# Patient Record
Sex: Female | Born: 1980 | Race: White | Hispanic: No | Marital: Single | Smoking: Never smoker
Health system: Southern US, Community
[De-identification: ages and names within clinical notes are randomized; demographics above are authoritative.]

## PROBLEM LIST (undated history)

## (undated) HISTORY — PX: NO PAST SURGERIES: SHX2092

---

## 2011-11-27 ENCOUNTER — Emergency Department: Payer: Self-pay | Admitting: Internal Medicine

## 2011-11-27 LAB — URINALYSIS, COMPLETE
Bacteria: NONE SEEN
Glucose,UR: NEGATIVE mg/dL (ref 0–75)
Ketone: NEGATIVE
Nitrite: NEGATIVE
Protein: NEGATIVE
RBC,UR: 1 /HPF (ref 0–5)
Specific Gravity: 1.024 (ref 1.003–1.030)
Squamous Epithelial: 12
WBC UR: 14 /HPF (ref 0–5)

## 2011-11-27 LAB — DRUG SCREEN, URINE
Barbiturates, Ur Screen: NEGATIVE (ref ?–200)
Benzodiazepine, Ur Scrn: NEGATIVE (ref ?–200)
Cannabinoid 50 Ng, Ur ~~LOC~~: NEGATIVE (ref ?–50)
Cocaine Metabolite,Ur ~~LOC~~: NEGATIVE (ref ?–300)
MDMA (Ecstasy)Ur Screen: NEGATIVE (ref ?–500)
Opiate, Ur Screen: NEGATIVE (ref ?–300)

## 2011-11-27 LAB — CBC
HCT: 40 % (ref 35.0–47.0)
HGB: 13.2 g/dL (ref 12.0–16.0)
MCH: 28.6 pg (ref 26.0–34.0)
MCHC: 33.1 g/dL (ref 32.0–36.0)
MCV: 86 fL (ref 80–100)
RDW: 13.5 % (ref 11.5–14.5)

## 2011-11-27 LAB — COMPREHENSIVE METABOLIC PANEL
Albumin: 3.8 g/dL (ref 3.4–5.0)
Anion Gap: 7 (ref 7–16)
BUN: 11 mg/dL (ref 7–18)
Bilirubin,Total: 0.3 mg/dL (ref 0.2–1.0)
Chloride: 110 mmol/L — ABNORMAL HIGH (ref 98–107)
Creatinine: 0.93 mg/dL (ref 0.60–1.30)
Glucose: 92 mg/dL (ref 65–99)
Potassium: 4.7 mmol/L (ref 3.5–5.1)
SGOT(AST): 21 U/L (ref 15–37)
SGPT (ALT): 21 U/L (ref 12–78)
Total Protein: 8 g/dL (ref 6.4–8.2)

## 2011-11-27 LAB — PROTIME-INR
INR: 1
Prothrombin Time: 13.4 secs (ref 11.5–14.7)

## 2013-04-17 ENCOUNTER — Emergency Department: Payer: Self-pay | Admitting: Emergency Medicine

## 2013-04-17 LAB — COMPREHENSIVE METABOLIC PANEL
ALBUMIN: 4 g/dL (ref 3.4–5.0)
Alkaline Phosphatase: 88 U/L
Anion Gap: 5 — ABNORMAL LOW (ref 7–16)
BILIRUBIN TOTAL: 0.2 mg/dL (ref 0.2–1.0)
BUN: 11 mg/dL (ref 7–18)
CREATININE: 0.83 mg/dL (ref 0.60–1.30)
Calcium, Total: 9.2 mg/dL (ref 8.5–10.1)
Chloride: 113 mmol/L — ABNORMAL HIGH (ref 98–107)
Co2: 20 mmol/L — ABNORMAL LOW (ref 21–32)
EGFR (African American): >60
EGFR (Non-African Amer.): >60
Glucose: 93 mg/dL (ref 65–99)
Osmolality: 275 (ref 275–301)
Potassium: 3.9 mmol/L (ref 3.5–5.1)
SGOT(AST): 28 U/L (ref 15–37)
SGPT (ALT): 31 U/L (ref 12–78)
SODIUM: 138 mmol/L (ref 136–145)
TOTAL PROTEIN: 8.3 g/dL — AB (ref 6.4–8.2)

## 2013-04-17 LAB — CBC WITH DIFFERENTIAL/PLATELET
BASOS ABS: 0 10*3/uL (ref 0.0–0.1)
Basophil %: 0.4 %
Eosinophil #: 0.1 10*3/uL (ref 0.0–0.7)
Eosinophil %: 0.9 %
HCT: 43.2 % (ref 35.0–47.0)
HGB: 14.4 g/dL (ref 12.0–16.0)
LYMPHS PCT: 22.2 %
Lymphocyte #: 2.2 10*3/uL (ref 1.0–3.6)
MCH: 28.6 pg (ref 26.0–34.0)
MCHC: 33.4 g/dL (ref 32.0–36.0)
MCV: 86 fL (ref 80–100)
MONO ABS: 0.8 x10 3/mm (ref 0.2–0.9)
MONOS PCT: 7.7 %
NEUTROS PCT: 68.8 %
Neutrophil #: 6.8 10*3/uL — ABNORMAL HIGH (ref 1.4–6.5)
Platelet: 351 10*3/uL (ref 150–440)
RBC: 5.05 10*6/uL (ref 3.80–5.20)
RDW: 13.4 % (ref 11.5–14.5)
WBC: 9.9 10*3/uL (ref 3.6–11.0)

## 2013-04-17 LAB — URINALYSIS, COMPLETE
BILIRUBIN, UR: NEGATIVE
Glucose,UR: NEGATIVE mg/dL (ref 0–75)
Ketone: NEGATIVE
Leukocyte Esterase: NEGATIVE
Nitrite: NEGATIVE
Ph: 5 (ref 4.5–8.0)
RBC,UR: 2912 /HPF (ref 0–5)
Specific Gravity: 1.031 (ref 1.003–1.030)
Squamous Epithelial: 16

## 2018-05-08 ENCOUNTER — Encounter: Payer: Self-pay | Admitting: Emergency Medicine

## 2018-05-08 ENCOUNTER — Other Ambulatory Visit: Payer: Self-pay

## 2018-05-08 ENCOUNTER — Emergency Department
Admission: EM | Admit: 2018-05-08 | Discharge: 2018-05-08 | Disposition: A | Discharge status: Home / Self Care | Payer: No Typology Code available for payment source | Attending: Emergency Medicine | Admitting: Emergency Medicine

## 2018-05-08 DIAGNOSIS — S61212A Laceration without foreign body of right middle finger without damage to nail, initial encounter: Secondary | ICD-10-CM | POA: Diagnosis not present

## 2018-05-08 DIAGNOSIS — W269XXA Contact with unspecified sharp object(s), initial encounter: Secondary | ICD-10-CM | POA: Diagnosis not present

## 2018-05-08 DIAGNOSIS — Y939 Activity, unspecified: Secondary | ICD-10-CM | POA: Diagnosis not present

## 2018-05-08 DIAGNOSIS — Y990 Civilian activity done for income or pay: Secondary | ICD-10-CM | POA: Insufficient documentation

## 2018-05-08 DIAGNOSIS — Y929 Unspecified place or not applicable: Secondary | ICD-10-CM | POA: Insufficient documentation

## 2018-05-08 DIAGNOSIS — S6991XA Unspecified injury of right wrist, hand and finger(s), initial encounter: Secondary | ICD-10-CM | POA: Diagnosis present

## 2018-05-08 MED ORDER — LIDOCAINE HCL (PF) 1 % IJ SOLN
5.0000 mL | Freq: Once | INTRAMUSCULAR | Status: AC
  Administered 2018-05-08: 5 mL
  Filled 2018-05-08: qty 5

## 2018-05-08 NOTE — ED Provider Notes (Addendum)
[facility] Emergency Department Provider Note ____________________________________________  Time seen: 2118  I have reviewed the triage vital signs and the nursing notes.  HISTORY  Chief Complaint  Laceration  HPI Sally Davis is a 38 y.o. female presents to the ED from work, for evaluation of a work injury.  She reports she accidentally cut the palmar aspect of her right hand at the base of the middle finger, on a piece of metal at work.  She presents now for evaluation of her injury.  She denies any other injury at this time.  She reports her tetanus status is up-to-date.  History reviewed. No pertinent past medical history.  There are no active problems to display for this patient.  History reviewed. No pertinent surgical history.  Prior to Admission medications   Not on File    Allergies Morphine and related  No family history on file.  Social History Social History   Tobacco Use  . Smoking status: Never Smoker  . Smokeless tobacco: Never Used  Substance Use Topics  . Alcohol use: Not on file  . Drug use: Not on file    Review of Systems  Constitutional: Negative for fever. Cardiovascular: Negative for chest pain. Respiratory: Negative for shortness of breath. Musculoskeletal: Negative for back pain. Right middle finger laceration as above Skin: Negative for rash. Neurological: Negative for headaches, focal weakness or numbness. ____________________________________________  PHYSICAL EXAM:  VITAL SIGNS: ED Triage Vitals  Enc Vitals Group     BP 05/08/18 2041 130/80     Pulse Rate 05/08/18 2041 66     Resp 05/08/18 2041 18     Temp 05/08/18 2041 98 F (36.7 C)     Temp Source 05/08/18 2041 Oral     SpO2 05/08/18 2041 100 %     Weight 05/08/18 2038 275 lb (124.7 kg)     Height 05/08/18 2038 5\' 6"  (1.676 m)     Head Circumference --      Peak Flow --      Pain Score 05/08/18 2038 4     Pain Loc --      Pain Edu? --    Excl. in GC? --     Constitutional: Alert and oriented. Well appearing and in no distress. Head: Normocephalic and atraumatic. Eyes: Conjunctivae are normal. Normal extraocular movements Cardiovascular: Normal rate, regular rhythm. Normal distal pulses. Respiratory: Normal respiratory effort. No wheezes/rales/rhonchi. Musculoskeletal: Normal composite fist on the right hand.  Patient with a superficial laceration along the palmar crease at the base of the middle finger.  Nontender with normal range of motion in all extremities.  Neurologic:  Normal gross sensation.  Normal speech and language. No gross focal neurologic deficits are appreciated. Skin:  Skin is warm, dry and intact. No rash noted. ____________________________________________  PROCEDURES  .Marland Kitchen.Laceration Repair Date/Time: 05/08/2018 10:49 PM Performed by: Lissa HoardMenshew, Jairo Bellew V Bacon, PA-C Authorized by: Lissa HoardMenshew, Alfhild Partch V Bacon, PA-C   Consent:    Consent obtained:  Verbal   Consent given by:  Patient   Risks discussed:  Pain and infection Anesthesia (see MAR for exact dosages):    Anesthesia method: transthecal block. Laceration details:    Location:  Finger   Finger location:  R long finger   Length (cm):  2   Depth (mm):  3 Repair type:    Repair type:  Simple Pre-procedure details:    Preparation:  Patient was prepped and draped in usual sterile fashion Treatment:    Area  cleansed with:  Betadine   Amount of cleaning:  Standard   Irrigation solution:  Sterile saline   Irrigation method:  Syringe Skin repair:    Repair method:  Sutures   Suture size:  4-0   Suture material:  Nylon   Suture technique:  Simple interrupted   Number of sutures:  3 Approximation:    Approximation:  Close Post-procedure details:    Dressing:  Non-adherent dressing and splint for protection   Patient tolerance of procedure:  Tolerated well, no immediate complications  ____________________________________________  INITIAL  IMPRESSION / ASSESSMENT AND PLAN / ED COURSE  Patient with ED evaluation management of an accidental laceration to the right middle finger while at work.  Patient is wound is repaired using normal sutures.  Wound care instructions are provided to the patient.  She will return to work with instructions to keep the wound and/or dressing, clean and dry.  She will follow-up with her provider or the company's provider for suture removal in 7 to 10 days. ____________________________________________  FINAL CLINICAL IMPRESSION(S) / ED DIAGNOSES  Final diagnoses:  Laceration of right middle finger without foreign body without damage to nail, initial encounter      Lissa HoardMenshew, Ashwika Freels V Bacon, PA-C 05/08/18 [address]2252    Jorgina Binning, Charlesetta IvoryJenise V Bacon, PA-C 05/08/18 2252    Sharman CheekStafford, Phillip, MD 05/08/18 [phone]

## 2018-05-08 NOTE — Discharge Instructions (Addendum)
Keep the wound clean, dry, and covered. Follow-up with your provider or your company's provider in 7-10 days for suture removal. Take Tylenol and Motrin for pain, as needed.

## 2018-05-08 NOTE — ED Triage Notes (Signed)
Patient ambulatory to triage with steady gait, without difficulty or distress noted; pt reports cutting right middle finger on piece of metal while at work [employer]; pt employed with [employer] in [place] (workers comp profile indicates testing only on request)

## 2018-05-15 DIAGNOSIS — Z4800 Encounter for change or removal of nonsurgical wound dressing: Secondary | ICD-10-CM | POA: Insufficient documentation

## 2018-05-15 DIAGNOSIS — Z5321 Procedure and treatment not carried out due to patient leaving prior to being seen by health care provider: Secondary | ICD-10-CM | POA: Insufficient documentation

## 2018-05-15 NOTE — ED Triage Notes (Signed)
Patient reports cutting 3rd digit right hand on sheet metal on 1/30, sutures placed at this ED. Redness seen at suture line. Patient purulent drainage at home.

## 2018-05-16 ENCOUNTER — Emergency Department
Admission: EM | Admit: 2018-05-16 | Discharge: 2018-05-16 | Disposition: A | Discharge status: Home / Self Care | Payer: No Typology Code available for payment source | Attending: Emergency Medicine | Admitting: Emergency Medicine

## 2018-05-16 NOTE — ED Notes (Signed)
No answer when called several times from lobby 

## 2018-05-18 ENCOUNTER — Encounter: Payer: Self-pay | Admitting: Emergency Medicine

## 2018-05-18 ENCOUNTER — Emergency Department
Admission: EM | Admit: 2018-05-18 | Discharge: 2018-05-18 | Disposition: A | Discharge status: Home / Self Care | Payer: No Typology Code available for payment source | Attending: Emergency Medicine | Admitting: Emergency Medicine

## 2018-05-18 ENCOUNTER — Other Ambulatory Visit: Payer: Self-pay

## 2018-05-18 DIAGNOSIS — X58XXXD Exposure to other specified factors, subsequent encounter: Secondary | ICD-10-CM | POA: Diagnosis not present

## 2018-05-18 DIAGNOSIS — S61212D Laceration without foreign body of right middle finger without damage to nail, subsequent encounter: Secondary | ICD-10-CM | POA: Insufficient documentation

## 2018-05-18 DIAGNOSIS — Z5189 Encounter for other specified aftercare: Secondary | ICD-10-CM

## 2018-05-18 MED ORDER — CEPHALEXIN 500 MG PO CAPS: 500.0000 mg | ORAL_CAPSULE | Freq: Four times a day (QID) | ORAL | 0 refills | Status: DC

## 2018-05-18 NOTE — Discharge Instructions (Signed)
Continue to care for your laceration as you have been doing.  Your medication was sent to pharmacy and [facility] and you should start taking it today.  Also your blood pressure was elevated today in the emergency department.  If this remains elevated you should have your blood pressure rechecked again and possibly treated for hypertension especially if it runs in your family.  Return to the ED or any urgent care for suture removal in approximately 5 -6 days

## 2018-05-18 NOTE — ED Notes (Signed)
Pt states she believes her stitches are infected. Area is well approximated healing redness. Small spot of yellow discharge on the bandage covering the would. The wound is in the inner crease of the finger and she states it hurts when she extends her finger.

## 2018-05-18 NOTE — ED Provider Notes (Signed)
[facility] Emergency Department Provider Note  ____________________________________________   First MD Initiated Contact with Patient 05/18/18 1419     (approximate)  I have reviewed the triage vital signs and the nursing notes.   HISTORY  Chief Complaint Wound Check   HPI Sally Davis is a 38 y.o. female   is present with suspected infection around her sutured finger.  Patient states that she has been taking care of it as she was instructed and very was sutured on 1/30 at the base of the third digit.  Patient began seeing some redness and now believes she is seeing pus.  She denies any fever and has not seen any extending erythema from this area.   History reviewed. No pertinent past medical history.  There are no active problems to display for this patient.   History reviewed. No pertinent surgical history.  Prior to Admission medications   Medication Sig Start Date End Date Taking? Authorizing Provider  cephALEXin (KEFLEX) 500 MG capsule Take 1 capsule (500 mg total) by mouth 4 (four) times daily. 05/18/18   Tommi Rumps, PA-C    Allergies Morphine and related  History reviewed. No pertinent family history.  Social History Social History   Tobacco Use  . Smoking status: Never Smoker  . Smokeless tobacco: Never Used  Substance Use Topics  . Alcohol use: Never    Frequency: Never  . Drug use: Never    Review of Systems Constitutional: No fever/chills Cardiovascular: Denies chest pain. Respiratory: Denies shortness of breath. Musculoskeletal: Negative for right hand pain. Skin: Positive for possible infection at suture site. Neurological: Negative for headaches, focal weakness or numbness. ___________________________________________   PHYSICAL EXAM:  VITAL SIGNS: ED Triage Vitals  Enc Vitals Group     BP 05/18/18 1324 (!) 176/103     Pulse Rate 05/18/18 1324 81     Resp 05/18/18 1324 18     Temp 05/18/18 1324 98.4 F  (36.9 C)     Temp Source 05/18/18 1324 Oral     SpO2 05/18/18 1324 100 %     Weight 05/18/18 1327 275 lb 9.2 oz (125 kg)     Height 05/18/18 1327 5\' 6"  (1.676 m)     Head Circumference --      Peak Flow --      Pain Score 05/18/18 1327 6     Pain Loc --      Pain Edu? --      Excl. in GC? --    Constitutional: Alert and oriented. Well appearing and in no acute distress. Eyes: Conjunctivae are normal.  Head: Atraumatic. Neck: No stridor.   Cardiovascular: Normal rate, regular rhythm. Grossly normal heart sounds.  Good peripheral circulation. Respiratory: Normal respiratory effort.  No retractions. Lungs CTAB. Musculoskeletal: Moves digits on the right hand without any difficulty. Neurologic:  Normal speech and language. No gross focal neurologic deficits are appreciated. . Skin:  Skin is warm, dry.  There is a sutured site at the base of the third digit right hand volar aspect that is erythematous with some small areas of pus at the suture itself.  No extending cellulitis but there is some soft tissue edema present. Psychiatric: Mood and affect are normal. Speech and behavior are normal.  ____________________________________________   LABS (all labs ordered are listed, but only abnormal results are displayed)  Labs Reviewed - No data to display  PROCEDURES  Procedure(s) performed: None  Procedures  Critical Care performed: No  ____________________________________________   INITIAL IMPRESSION / ASSESSMENT AND PLAN / ED COURSE  As part of my medical decision making, I reviewed the following data within the electronic MEDICAL RECORD NUMBER Notes from prior ED visits and [place] Controlled Substance Database  Patient presents to the ED with concerns of a skin infection around her sutured area from 05/08/2018.  On exam the third digit laceration site does appear to be infected but no cellulitis surrounding yes.  Patient was placed on Keflex and told to continue cleaning the area as she  is done.  Patient will return in approximately 5 days for suture removal.  ____________________________________________   FINAL CLINICAL IMPRESSION(S) / ED DIAGNOSES  Final diagnoses:  Encounter for wound re-check     ED Discharge Orders         Ordered    cephALEXin (KEFLEX) 500 MG capsule  4 times daily     05/18/18 1441           Note:  This document was prepared using Dragon voice recognition software and may include unintentional dictation errors.    Tommi Rumps, PA-C 05/18/18 1604    Sharyn Creamer, MD 05/19/18 1326

## 2018-05-18 NOTE — ED Triage Notes (Signed)
Pt states presents to ED via POV with c/o redness and purulent drainage from sutures. Pt states had stitches placed on 1/30 to base of 3rd digit.

## 2018-06-05 ENCOUNTER — Ambulatory Visit
Admission: EM | Admit: 2018-06-05 | Discharge: 2018-06-05 | Disposition: A | Discharge status: Home / Self Care | Payer: No Typology Code available for payment source | Attending: Family Medicine | Admitting: Family Medicine

## 2018-06-05 ENCOUNTER — Encounter: Payer: Self-pay | Admitting: Emergency Medicine

## 2018-06-05 ENCOUNTER — Other Ambulatory Visit: Payer: Self-pay

## 2018-06-05 DIAGNOSIS — Z4802 Encounter for removal of sutures: Secondary | ICD-10-CM | POA: Diagnosis not present

## 2018-06-05 DIAGNOSIS — S61212D Laceration without foreign body of right middle finger without damage to nail, subsequent encounter: Secondary | ICD-10-CM

## 2018-06-05 NOTE — ED Triage Notes (Signed)
Patient in today for suture removal (W/C). Sutures placed 05/08/18 at [facility] ED. Patient states her finger got infected and had to be on antibiotics. She was told to wait until infection cleared before having sutures removed.

## 2018-06-05 NOTE — Discharge Instructions (Signed)
Routine care - Normal bathing.  Just soap and water.  Nothing else needed.  Take care   Dr. Adriana Simas

## 2018-06-05 NOTE — ED Provider Notes (Signed)
[facility]    CSN: [id number] Arrival date & time: 06/05/18  1116  History   Chief Complaint Chief Complaint  Patient presents with  . Suture / Staple Removal    W/C   HPI   38 year old female presents for suture removal.  38 year old female suffered a laceration on 1/30 (R middle finger).  Sutures were placed.  She is had complications due to infection.  She is doing well at this time.  She now presents for home today.  No current drainage.  She does note that the area is still slightly tender.  Wound has healed completely.  No other complaints.  Social Hx reviewed as below. Social History Social History   Tobacco Use  . Smoking status: Never Smoker  . Smokeless tobacco: Never Used  Substance Use Topics  . Alcohol use: Yes    Frequency: Never    Comment: rarely  . Drug use: Never   Allergies   Morphine and related   Review of Systems Review of Systems  Constitutional: Negative.   Skin: Positive for wound.   Physical Exam Triage Vital Signs ED Triage Vitals  Enc Vitals Group     BP 06/05/18 1141 (!) 191/121     Pulse Rate 06/05/18 1141 70     Resp 06/05/18 1141 16     Temp 06/05/18 1141 98.2 F (36.8 C)     Temp Source 06/05/18 1141 Oral     SpO2 06/05/18 1141 100 %     Weight 06/05/18 1140 261 lb (118.4 kg)     Height 06/05/18 1140 5\' 6"  (1.676 m)     Head Circumference --      Peak Flow --      Pain Score 06/05/18 1139 0     Pain Loc --      Pain Edu? --      Excl. in GC? --    Updated Vital Signs BP (!) 186/113 (BP Location: Right Arm) Comment: large cuff  Pulse 70   Temp 98.2 F (36.8 C) (Oral)   Resp 16   Ht 5\' 6"  (1.676 m)   Wt 118.4 kg   SpO2 100%   BMI 42.13 kg/m   Visual Acuity Right Eye Distance:   Left Eye Distance:   Bilateral Distance:    Right Eye Near:   Left Eye Near:    Bilateral Near:     Physical Exam Vitals signs and nursing note reviewed.  Constitutional:      General: She is not in acute  distress.    Appearance: Normal appearance.  Skin:    Comments: Healed laceration of the right middle finger (Palmar aspect, crease, MCP).  Neurological:     Mental Status: She is alert.    UC Treatments / Results  Labs (all labs ordered are listed, but only abnormal results are displayed) Labs Reviewed - No data to display  EKG None  Radiology No results found.  Procedures Procedures (including critical care time) 3 simple interrupted sutures removed today in standard fashion without difficulty.  Medications Ordered in UC Medications - No data to display  Initial Impression / Assessment and Plan / UC Course  I have reviewed the triage vital signs and the nursing notes.  Pertinent labs & imaging results that were available during my care of the patient were reviewed by me and considered in my medical decision making (see chart for details).    38 year old female presents for suture removal.  Sutures removed in standard  fashion today without difficulty.  Final Clinical Impressions(s) / UC Diagnoses   Final diagnoses:  Visit for suture removal     Discharge Instructions     Routine care - Normal bathing.  Just soap and water.  Nothing else needed.  Take care   Dr. Adriana Simas    ED Prescriptions    None     Controlled Substance Prescriptions [place] Controlled Substance Registry consulted? Not Applicable   Tommie Sams, DO 06/05/18 1209

## 2018-08-06 ENCOUNTER — Encounter: Payer: Self-pay | Admitting: *Deleted

## 2018-08-06 ENCOUNTER — Emergency Department: Payer: No Typology Code available for payment source

## 2018-08-06 ENCOUNTER — Other Ambulatory Visit: Payer: Self-pay

## 2018-08-06 DIAGNOSIS — Y92410 Unspecified street and highway as the place of occurrence of the external cause: Secondary | ICD-10-CM | POA: Diagnosis not present

## 2018-08-06 DIAGNOSIS — Y999 Unspecified external cause status: Secondary | ICD-10-CM | POA: Insufficient documentation

## 2018-08-06 DIAGNOSIS — Z23 Encounter for immunization: Secondary | ICD-10-CM | POA: Diagnosis not present

## 2018-08-06 DIAGNOSIS — S52602A Unspecified fracture of lower end of left ulna, initial encounter for closed fracture: Secondary | ICD-10-CM | POA: Insufficient documentation

## 2018-08-06 DIAGNOSIS — Y9389 Activity, other specified: Secondary | ICD-10-CM | POA: Diagnosis not present

## 2018-08-06 DIAGNOSIS — S59912A Unspecified injury of left forearm, initial encounter: Secondary | ICD-10-CM | POA: Diagnosis present
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
## 2018-08-07 ENCOUNTER — Emergency Department: Payer: No Typology Code available for payment source

## 2018-08-07 ENCOUNTER — Emergency Department
Admission: EM | Admit: 2018-08-07 | Discharge: 2018-08-07 | Disposition: A | Discharge status: Home / Self Care | Payer: No Typology Code available for payment source | Attending: Emergency Medicine | Admitting: Emergency Medicine

## 2018-08-07 DIAGNOSIS — S52602A Unspecified fracture of lower end of left ulna, initial encounter for closed fracture: Secondary | ICD-10-CM

## 2018-08-07 DIAGNOSIS — T07XXXA Unspecified multiple injuries, initial encounter: Secondary | ICD-10-CM

## 2018-08-07 DIAGNOSIS — Z9229 Personal history of other drug therapy: Secondary | ICD-10-CM

## 2018-08-07 MED ORDER — CEPHALEXIN 500 MG PO CAPS: 500.0000 mg | ORAL_CAPSULE | Freq: Four times a day (QID) | ORAL | 0 refills | Status: AC

## 2018-08-07 MED ORDER — OXYCODONE-ACETAMINOPHEN 5-325 MG PO TABS: 2.0000 | ORAL_TABLET | Freq: Four times a day (QID) | ORAL | 0 refills | Status: AC | PRN

## 2018-08-07 MED ORDER — BACITRACIN ZINC 500 UNIT/GM EX OINT
TOPICAL_OINTMENT | CUTANEOUS | Status: AC
  Administered 2018-08-07: 1 via TOPICAL
  Filled 2018-08-07: qty 0.9

## 2018-08-07 MED ORDER — ONDANSETRON 4 MG PO TBDP
4.0000 mg | ORAL_TABLET | Freq: Once | ORAL | Status: AC
  Administered 2018-08-07: 03:00:00 4 mg via ORAL

## 2018-08-07 MED ORDER — ONDANSETRON 4 MG PO TBDP: ORAL_TABLET | ORAL | 0 refills | Status: AC

## 2018-08-07 MED ORDER — LIDOCAINE HCL (PF) 1 % IJ SOLN: 5.0000 mL | Freq: Once | INTRAMUSCULAR | Status: DC

## 2018-08-07 MED ORDER — TETANUS-DIPHTH-ACELL PERTUSSIS 5-2.5-18.5 LF-MCG/0.5 IM SUSP
0.5000 mL | Freq: Once | INTRAMUSCULAR | Status: AC
  Administered 2018-08-07: 0.5 mL via INTRAMUSCULAR
  Filled 2018-08-07: qty 0.5

## 2018-08-07 MED ORDER — OXYCODONE-ACETAMINOPHEN 5-325 MG PO TABS
2.0000 | ORAL_TABLET | Freq: Once | ORAL | Status: AC
  Administered 2018-08-07: 02:00:00 2 via ORAL
  Filled 2018-08-07: qty 2

## 2018-08-07 MED ORDER — CEPHALEXIN 500 MG PO CAPS
500.0000 mg | ORAL_CAPSULE | Freq: Once | ORAL | Status: AC
  Administered 2018-08-07: 03:00:00 500 mg via ORAL
  Filled 2018-08-07: qty 1

## 2018-08-07 MED ORDER — ONDANSETRON 4 MG PO TBDP
ORAL_TABLET | ORAL | Status: AC
  Administered 2018-08-07: 03:00:00 4 mg via ORAL
  Filled 2018-08-07: qty 1

## 2018-08-07 MED ORDER — LIDOCAINE HCL (PF) 1 % IJ SOLN
INTRAMUSCULAR | Status: AC
  Filled 2018-08-07: qty 5

## 2018-08-07 NOTE — ED Notes (Signed)
Computer froze/Signature pad not working. Pt verbalizes understanding
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
## 2018-08-07 NOTE — Discharge Instructions (Signed)
As we discussed, you have a fracture to the end of your ulna at your wrist.  There is no evidence of any other fractures.  Please keep your splint clean and dry and read through the included information about splint care.  He will need to schedule a follow-up appointment with Dr. Rosita Kea or Dr. Allena Katz for next week.  Be sure and tell them that you have some cuts to your skin that were bandaged with antibiotic ointment before the splint was placed; they should take a look at the wounds when your splint is changed for a cast.  Please take the prescription for antibiotics as prescribed to try to avoid getting a skin infection from your lacerations.  You can use over-the-counter pain medicine such as ibuprofen and Tylenol according to label instructions.  Take Percocet as prescribed for severe pain. Do not drink alcohol, drive or participate in any other potentially dangerous activities while taking this medication as it may make you sleepy. Do not take this medication with any other sedating medications, either prescription or over-the-counter. If you were prescribed Percocet or Vicodin, do not take these with acetaminophen (Tylenol) as it is already contained within these medications.   This medication is an opiate (or narcotic) pain medication and can be habit forming.  Use it as little as possible to achieve adequate pain control.  Do not use or use it with extreme caution if you have a history of opiate abuse or dependence.  If you are on a pain contract with your primary care doctor or a pain specialist, be sure to let them know you were prescribed this medication today from the [facility] Emergency Department.  This medication is intended for your use only - do not give any to anyone else and keep it in a secure place where nobody else, especially children, have access to it.  It will also cause or worsen constipation, so you may want to consider taking an over-the-counter stool softener while you are taking  this medication.    Return to the emergency department if you develop new or worsening symptoms that concern you.
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
